# Patient Record
Sex: Male | Born: 1995 | Race: Black or African American | Hispanic: No | Marital: Single | Smoking: Former smoker
Health system: Southern US, Community
[De-identification: ages and names within clinical notes are randomized; demographics above are authoritative.]

## PROBLEM LIST (undated history)

## (undated) DIAGNOSIS — G510 Bell's palsy: Secondary | ICD-10-CM

---
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
## 2017-01-04 ENCOUNTER — Encounter (HOSPITAL_COMMUNITY): Payer: Self-pay | Admitting: Emergency Medicine

## 2017-01-04 ENCOUNTER — Emergency Department (HOSPITAL_COMMUNITY)
Admission: EM | Admit: 2017-01-04 | Discharge: 2017-01-04 | Disposition: A | Discharge status: Home / Self Care | Payer: Medicaid Other | Attending: Emergency Medicine | Admitting: Emergency Medicine

## 2017-01-04 DIAGNOSIS — Z23 Encounter for immunization: Secondary | ICD-10-CM | POA: Insufficient documentation

## 2017-01-04 DIAGNOSIS — S71152A Open bite, left thigh, initial encounter: Secondary | ICD-10-CM | POA: Diagnosis not present

## 2017-01-04 DIAGNOSIS — Y9389 Activity, other specified: Secondary | ICD-10-CM | POA: Diagnosis not present

## 2017-01-04 DIAGNOSIS — Y929 Unspecified place or not applicable: Secondary | ICD-10-CM | POA: Diagnosis not present

## 2017-01-04 DIAGNOSIS — F17210 Nicotine dependence, cigarettes, uncomplicated: Secondary | ICD-10-CM | POA: Insufficient documentation

## 2017-01-04 DIAGNOSIS — Y998 Other external cause status: Secondary | ICD-10-CM | POA: Insufficient documentation

## 2017-01-04 DIAGNOSIS — W540XXA Bitten by dog, initial encounter: Secondary | ICD-10-CM | POA: Insufficient documentation

## 2017-01-04 DIAGNOSIS — T148XXA Other injury of unspecified body region, initial encounter: Secondary | ICD-10-CM

## 2017-01-04 MED ORDER — TETANUS-DIPHTH-ACELL PERTUSSIS 5-2.5-18.5 LF-MCG/0.5 IM SUSP
0.5000 mL | Freq: Once | INTRAMUSCULAR | Status: AC
  Administered 2017-01-04: 0.5 mL via INTRAMUSCULAR
  Filled 2017-01-04: qty 0.5

## 2017-01-04 MED ORDER — IBUPROFEN 800 MG PO TABS: 800.0000 mg | ORAL_TABLET | Freq: Three times a day (TID) | ORAL | 0 refills | Status: DC

## 2017-01-04 MED ORDER — AMOXICILLIN-POT CLAVULANATE 875-125 MG PO TABS: 1.0000 | ORAL_TABLET | Freq: Two times a day (BID) | ORAL | 0 refills | Status: DC

## 2017-01-04 MED ORDER — ACETAMINOPHEN 325 MG PO TABS
ORAL_TABLET | ORAL | Status: AC
  Filled 2017-01-04: qty 2

## 2017-01-04 MED ORDER — BACITRACIN ZINC 500 UNIT/GM EX OINT
1.0000 "application " | TOPICAL_OINTMENT | Freq: Two times a day (BID) | CUTANEOUS | Status: DC
  Administered 2017-01-04: 1 via TOPICAL
  Filled 2017-01-04: qty 1.8

## 2017-01-04 MED ORDER — ACETAMINOPHEN 325 MG PO TABS
650.0000 mg | ORAL_TABLET | Freq: Once | ORAL | Status: AC
  Administered 2017-01-04: 650 mg via ORAL
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
## 2019-08-06 ENCOUNTER — Ambulatory Visit (HOSPITAL_COMMUNITY)
Admission: EM | Admit: 2019-08-06 | Discharge: 2019-08-06 | Disposition: A | Discharge status: Home / Self Care | Payer: BC Managed Care – PPO | Attending: Family Medicine | Admitting: Family Medicine

## 2019-08-06 ENCOUNTER — Other Ambulatory Visit: Payer: Self-pay

## 2019-08-06 ENCOUNTER — Encounter (HOSPITAL_COMMUNITY): Payer: Self-pay

## 2019-08-06 DIAGNOSIS — Z0289 Encounter for other administrative examinations: Secondary | ICD-10-CM | POA: Diagnosis not present

## 2019-08-06 NOTE — ED Triage Notes (Signed)
Pt states he was nauseated this morning and is better now but needs a work note.  Denies vomiting, fever, any other symptoms.

## 2019-08-06 NOTE — ED Provider Notes (Signed)
[place]    CSN: [id number] Arrival date & time: 08/06/19  1426      History   Chief Complaint Chief Complaint  Patient presents with  . Nausea    HPI Walter Mack is a 24 y.o. male.   HPI  Healthy 24 year old.  On no medications.  States he had a wave of nausea this morning and called out of work.  He was told he needed a note to go back to work.  Afterwards he had biscuits and water.  He now feels normal.  Not scheduled to work again until tomorrow. No fever or chills.  No body aches.  No exposure to infection. No abdominal pain.  No diarrhea.  Patient did not vomit. He thinks he ate something that did not agree with him.  Past Medical History:  Diagnosis Date  . Bell's palsy   . Right wrist fracture 2007    There are no problems to display for this patient.   History reviewed. No pertinent surgical history.     Home Medications    Prior to Admission medications   Not on File    Family History Family History  Problem Relation Age of Onset  . Healthy Mother   . Healthy Father     Social History Social History   Tobacco Use  . Smoking status: Former Smoker    Packs/day: 0.25    Years: 2.00    Pack years: 0.50    Types: Cigarettes  . Smokeless tobacco: Never Used  Substance Use Topics  . Alcohol use: No  . Drug use: Yes    Frequency: 2.0 times per week    Types: Marijuana     Allergies   Patient has no known allergies.   Review of Systems Review of Systems  Constitutional: Negative for chills, fatigue and fever.  HENT: Negative for congestion.   Respiratory: Negative for cough and shortness of breath.   Gastrointestinal: Positive for nausea. Negative for diarrhea and vomiting.       Resolved  Musculoskeletal: Negative for myalgias.  Neurological: Negative for headaches.     Physical Exam Triage Vital Signs ED Triage Vitals  Enc Vitals Group     BP 08/06/19 1452 123/71     Pulse Rate 08/06/19 1452 66     Resp  08/06/19 1452 16     Temp 08/06/19 1452 98.1 F (36.7 C)     Temp Source 08/06/19 1452 Oral     SpO2 08/06/19 1452 100 %     Weight --      Height --      Head Circumference --      Peak Flow --      Pain Score 08/06/19 1446 0     Pain Loc --      Pain Edu? --      Excl. in [place]? --    No data found.  Updated Vital Signs BP 123/71 (BP Location: Left Arm)   Pulse 66   Temp 98.1 F (36.7 C) (Oral)   Resp 16   SpO2 100%      Physical Exam Vitals reviewed.  Constitutional:      General: He is not in acute distress.    Appearance: Normal appearance. He is well-developed.     Comments: Exam by observation  HENT:     Head: Normocephalic and atraumatic.     Mouth/Throat:     Comments: Mask in place Eyes:     Conjunctiva/sclera: Conjunctivae  normal.     Pupils: Pupils are equal, round, and reactive to light.  Cardiovascular:     Rate and Rhythm: Normal rate.  Pulmonary:     Effort: Pulmonary effort is normal. No respiratory distress.  Musculoskeletal:        General: Normal range of motion.     Cervical back: Normal range of motion.  Skin:    General: Skin is warm and dry.  Neurological:     Mental Status: He is alert.  Psychiatric:        Mood and Affect: Mood normal.        Behavior: Behavior normal.      UC Treatments / Results  Labs (all labs ordered are listed, but only abnormal results are displayed) Labs Reviewed - No data to display  EKG   Radiology No results found.  Procedures Procedures (including critical care time)  Medications Ordered in UC Medications - No data to display  Initial Impression / Assessment and Plan / UC Course  I have reviewed the triage vital signs and the nursing notes.  Pertinent labs & imaging results that were available during my care of the patient were reviewed by me and considered in my medical decision making (see chart for details).      Final Clinical Impressions(s) / UC Diagnoses   Final diagnoses:    Encounter to obtain excuse from work   Discharge Instructions   None    ED Prescriptions    None     PDMP not reviewed this encounter.   Eustace Moore, MD 08/06/19 [phone]

## 2019-08-29 ENCOUNTER — Encounter (HOSPITAL_COMMUNITY): Payer: Self-pay

## 2019-08-29 ENCOUNTER — Other Ambulatory Visit: Payer: Self-pay

## 2019-08-29 ENCOUNTER — Ambulatory Visit (HOSPITAL_COMMUNITY)
Admission: EM | Admit: 2019-08-29 | Discharge: 2019-08-29 | Disposition: A | Discharge status: Home / Self Care | Payer: BC Managed Care – PPO | Attending: Family Medicine | Admitting: Family Medicine

## 2019-08-29 DIAGNOSIS — J3489 Other specified disorders of nose and nasal sinuses: Secondary | ICD-10-CM | POA: Diagnosis not present

## 2019-08-29 DIAGNOSIS — Z20822 Contact with and (suspected) exposure to covid-19: Secondary | ICD-10-CM | POA: Insufficient documentation

## 2019-08-29 DIAGNOSIS — Z87891 Personal history of nicotine dependence: Secondary | ICD-10-CM | POA: Insufficient documentation

## 2019-08-29 DIAGNOSIS — R519 Headache, unspecified: Secondary | ICD-10-CM | POA: Insufficient documentation

## 2019-08-29 DIAGNOSIS — R0981 Nasal congestion: Secondary | ICD-10-CM | POA: Diagnosis not present

## 2019-08-29 MED ORDER — CETIRIZINE-PSEUDOEPHEDRINE ER 5-120 MG PO TB12: 1.0000 | ORAL_TABLET | Freq: Every day | ORAL | 0 refills | Status: AC

## 2019-08-29 NOTE — ED Provider Notes (Signed)
[place]    CSN: [id number] Arrival date & time: 08/29/19  1342      History   Chief Complaint Chief Complaint  Patient presents with  . Headache    HPI Walter Mack is a 24 y.o. male.   Pt is a 24 year old male that presents with runny nose and nasal congestion with headache. Symptoms have been constant since yesterday. Left work early yesterday due to symptoms. Has not taken anything for the symptoms. No known sick contacts. No fever, chills, body aches, cough.   ROS per HPI      Past Medical History:  Diagnosis Date  . Bell's palsy   . Right wrist fracture 2007    There are no problems to display for this patient.   History reviewed. No pertinent surgical history.     Home Medications    Prior to Admission medications   Medication Sig Start Date End Date Taking? Authorizing Provider  cetirizine-pseudoephedrine (ZYRTEC-D) 5-120 MG tablet Take 1 tablet by mouth daily. 08/29/19   Orvan July, NP    Family History Family History  Problem Relation Age of Onset  . Healthy Mother   . Healthy Father     Social History Social History   Tobacco Use  . Smoking status: Former Smoker    Packs/day: 0.25    Years: 2.00    Pack years: 0.50    Types: Cigarettes  . Smokeless tobacco: Never Used  Substance Use Topics  . Alcohol use: No  . Drug use: Yes    Frequency: 2.0 times per week    Types: Marijuana     Allergies   Patient has no known allergies.   Review of Systems Review of Systems   Physical Exam Triage Vital Signs ED Triage Vitals  Enc Vitals Group     BP 08/29/19 1358 (!) 119/52     Pulse Rate 08/29/19 1358 67     Resp 08/29/19 1358 16     Temp 08/29/19 1358 98.2 F (36.8 C)     Temp Source 08/29/19 1358 Oral     SpO2 08/29/19 1358 97 %     Weight 08/29/19 1359 188 lb (85.3 kg)     Height --      Head Circumference --      Peak Flow --      Pain Score 08/29/19 1400 0     Pain Loc --      Pain Edu? --    Excl. in [place]? --    No data found.  Updated Vital Signs BP (!) 119/52 (BP Location: Left Arm)   Pulse 67   Temp 98.2 F (36.8 C) (Oral)   Resp 16   Wt 188 lb (85.3 kg)   SpO2 97%   BMI 22.29 kg/m   Visual Acuity Right Eye Distance:   Left Eye Distance:   Bilateral Distance:    Right Eye Near:   Left Eye Near:    Bilateral Near:     Physical Exam Vitals and nursing note reviewed.  Constitutional:      Appearance: Normal appearance.  HENT:     Head: Normocephalic and atraumatic.     Nose: Congestion and rhinorrhea present.  Eyes:     Conjunctiva/sclera: Conjunctivae normal.  Pulmonary:     Effort: Pulmonary effort is normal.  Musculoskeletal:        General: Normal range of motion.     Cervical back: Normal range of motion.  Skin:  General: Skin is warm and dry.  Neurological:     Mental Status: He is alert.  Psychiatric:        Mood and Affect: Mood normal.      UC Treatments / Results  Labs (all labs ordered are listed, but only abnormal results are displayed) Labs Reviewed  SARS CORONAVIRUS 2 (TAT 6-24 HRS)    EKG   Radiology No results found.  Procedures Procedures (including critical care time)  Medications Ordered in UC Medications - No data to display  Initial Impression / Assessment and Plan / UC Course  I have reviewed the triage vital signs and the nursing notes.  Pertinent labs & imaging results that were available during my care of the patient were reviewed by me and considered in my medical decision making (see chart for details).     Viral illness vs allergies.  treating with zyrtec D covid swab pending.  Work note given.   Final Clinical Impressions(s) / UC Diagnoses   Final diagnoses:  Sinus headache     Discharge Instructions     Sent Zyrtec-D to the pharmacy to help with your symptoms We will call you if your Covid results are positive.  You can check your MyChart for results.     ED Prescriptions     Medication Sig Dispense Auth. Provider   cetirizine-pseudoephedrine (ZYRTEC-D) 5-120 MG tablet Take 1 tablet by mouth daily. 30 tablet Dahlia Byes A, NP     PDMP not reviewed this encounter.   Janace Aris, NP 08/29/19 1439

## 2019-08-29 NOTE — ED Triage Notes (Signed)
Patient complains of headache and runny nose starting yesterday. States he left work yesterday and needs a Chiropractor note for today and tomorrow.

## 2019-08-29 NOTE — Discharge Instructions (Signed)
Sent Zyrtec-D to the pharmacy to help with your symptoms We will call you if your Covid results are positive.  You can check your MyChart for results.

## 2019-08-30 LAB — SARS CORONAVIRUS 2 (TAT 6-24 HRS): SARS Coronavirus 2: NEGATIVE

## 2020-09-19 ENCOUNTER — Encounter (HOSPITAL_COMMUNITY): Payer: Self-pay

## 2020-09-19 ENCOUNTER — Other Ambulatory Visit: Payer: Self-pay

## 2020-09-19 ENCOUNTER — Ambulatory Visit (HOSPITAL_COMMUNITY)
Admission: EM | Admit: 2020-09-19 | Discharge: 2020-09-19 | Disposition: A | Discharge status: Home / Self Care | Payer: BC Managed Care – PPO | Attending: Physician Assistant | Admitting: Physician Assistant

## 2020-09-19 DIAGNOSIS — T148XXA Other injury of unspecified body region, initial encounter: Secondary | ICD-10-CM

## 2020-09-19 MED ORDER — CYCLOBENZAPRINE HCL 5 MG PO TABS: 5.0000 mg | ORAL_TABLET | Freq: Three times a day (TID) | ORAL | 0 refills | Status: AC | PRN

## 2020-09-19 NOTE — Discharge Instructions (Addendum)
Recommend ice to affected areas Take flexeril as needed for muscle spasm Can take ibuprofen as needed.
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
## 2021-03-05 ENCOUNTER — Ambulatory Visit (HOSPITAL_COMMUNITY)
Admission: EM | Admit: 2021-03-05 | Discharge: 2021-03-05 | Disposition: A | Discharge status: Home / Self Care | Payer: Self-pay | Attending: Student | Admitting: Student

## 2021-03-05 ENCOUNTER — Ambulatory Visit (INDEPENDENT_AMBULATORY_CARE_PROVIDER_SITE_OTHER): Payer: Self-pay

## 2021-03-05 ENCOUNTER — Other Ambulatory Visit: Payer: Self-pay

## 2021-03-05 DIAGNOSIS — S92411A Displaced fracture of proximal phalanx of right great toe, initial encounter for closed fracture: Secondary | ICD-10-CM

## 2021-03-05 DIAGNOSIS — S91209A Unspecified open wound of unspecified toe(s) with damage to nail, initial encounter: Secondary | ICD-10-CM

## 2021-03-05 MED ORDER — TRAMADOL HCL 50 MG PO TABS: 50.0000 mg | ORAL_TABLET | Freq: Four times a day (QID) | ORAL | 0 refills | Status: AC | PRN

## 2021-03-05 MED ORDER — CEPHALEXIN 500 MG PO CAPS
500.0000 mg | ORAL_CAPSULE | Freq: Four times a day (QID) | ORAL | 0 refills | Status: AC
Start: 2021-03-05 — End: ?

## 2021-03-05 NOTE — Discharge Instructions (Addendum)
-  Tramadol for pain, up to every 6 hours. Dont take before driving. Avoid alcohol while taking this medications.  -Start the antibiotic: Keflex, 4x daily x5 days. You can take this with food if you have a sensitive stomach. -Wash your wound with gentle soap and water 1-2 times daily.  Let air dry or gently pat. You can follow with over-the-counter neosporin ointment (or similar). Keep wrapped during the day or when you're doing something that could get it dirty (working, [employer], cooking, Catering manager). Avoid cleansing with hydrogen peroxide or alcohol!! -Seek additional medical attention if the wound is getting worse instead of better- redness increasing in size, pain getting worse, new/worsening discharge, new fevers/chills, etc. -You have a broken big toe. Please follow-up with an orthopedist. I recommend [facility] at [address] [place], [place] [address]. You can schedule an appointment by calling [phone]) or online ([email or web address]), but they also have a walk-in clinic M-F 8a-8p and Sat 10a-3p.
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
## 2022-02-09 IMAGING — DX DG FOOT COMPLETE 3+V*R*
3 series · 3 of 3 positions shown · non-contrast
Comparison: None.

CLINICAL DATA: Great toe injury.

EXAM:
RIGHT FOOT COMPLETE - 3+ VIEW

[foot ap]
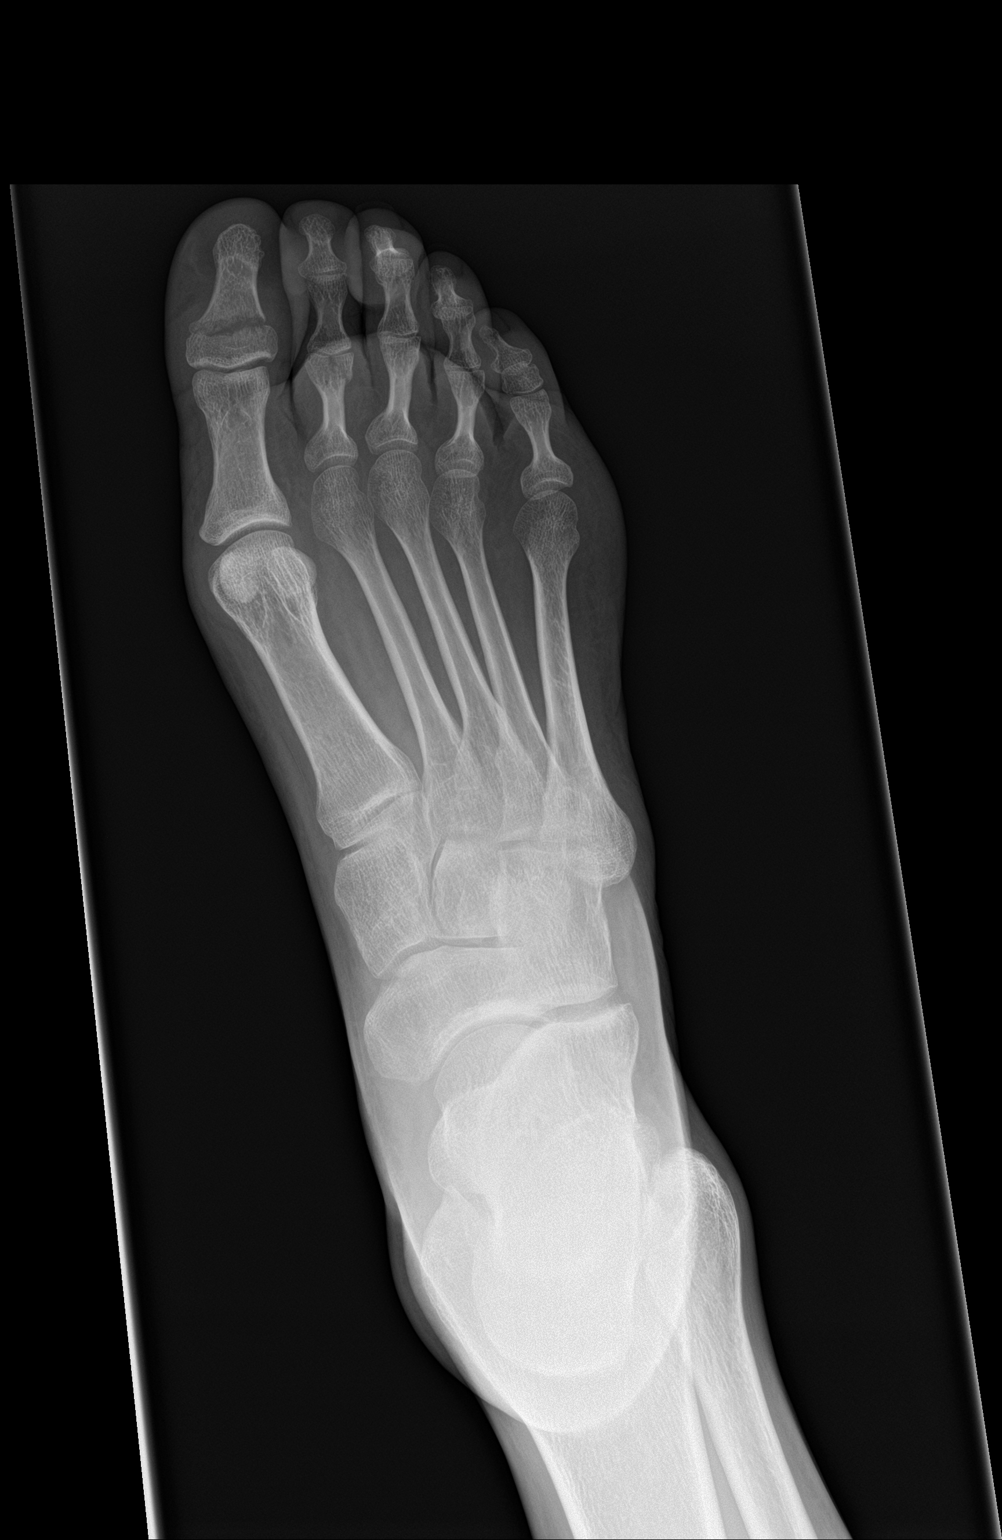

[foot obl]
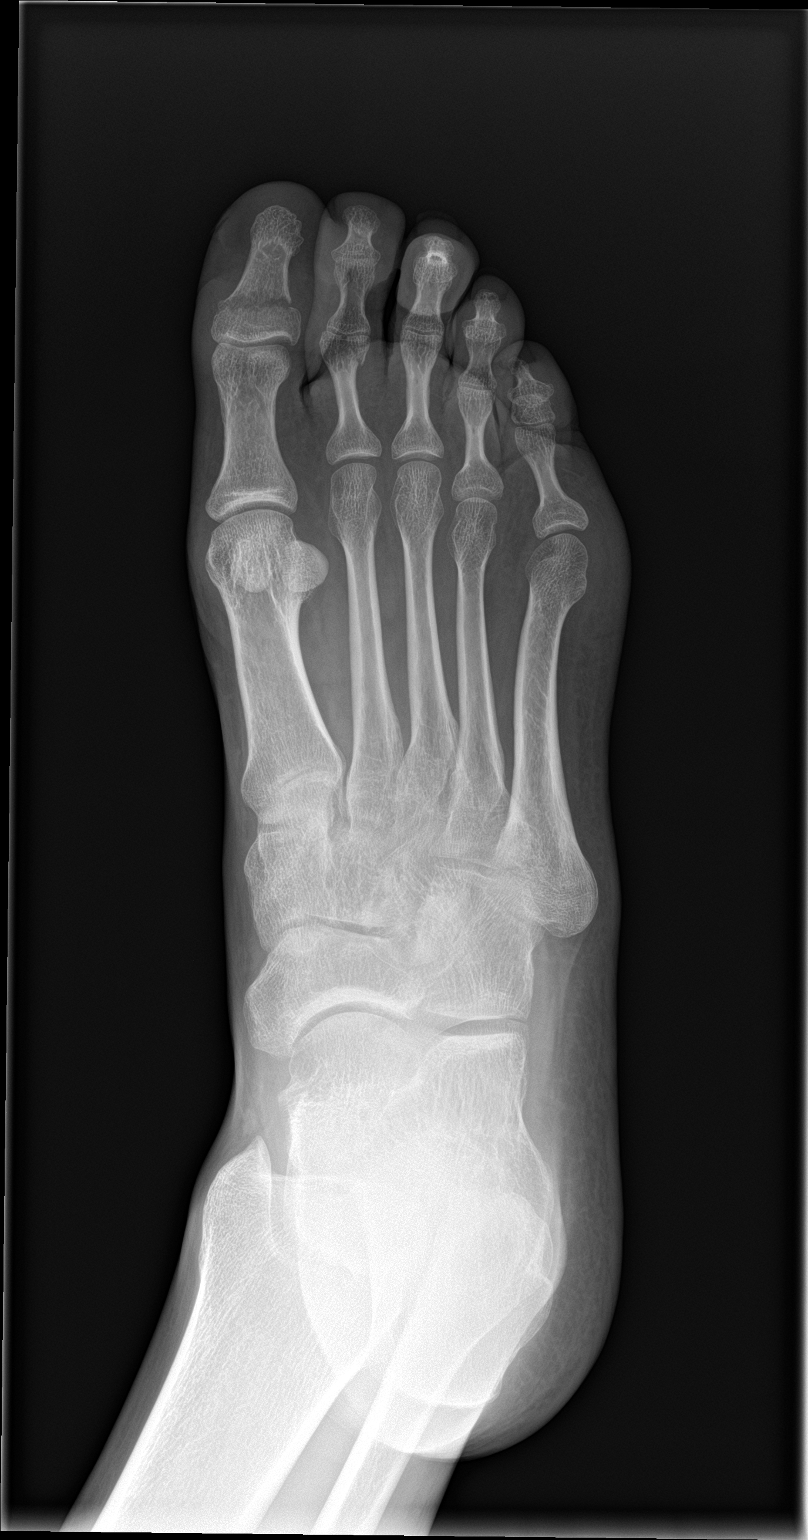

[foot lat]
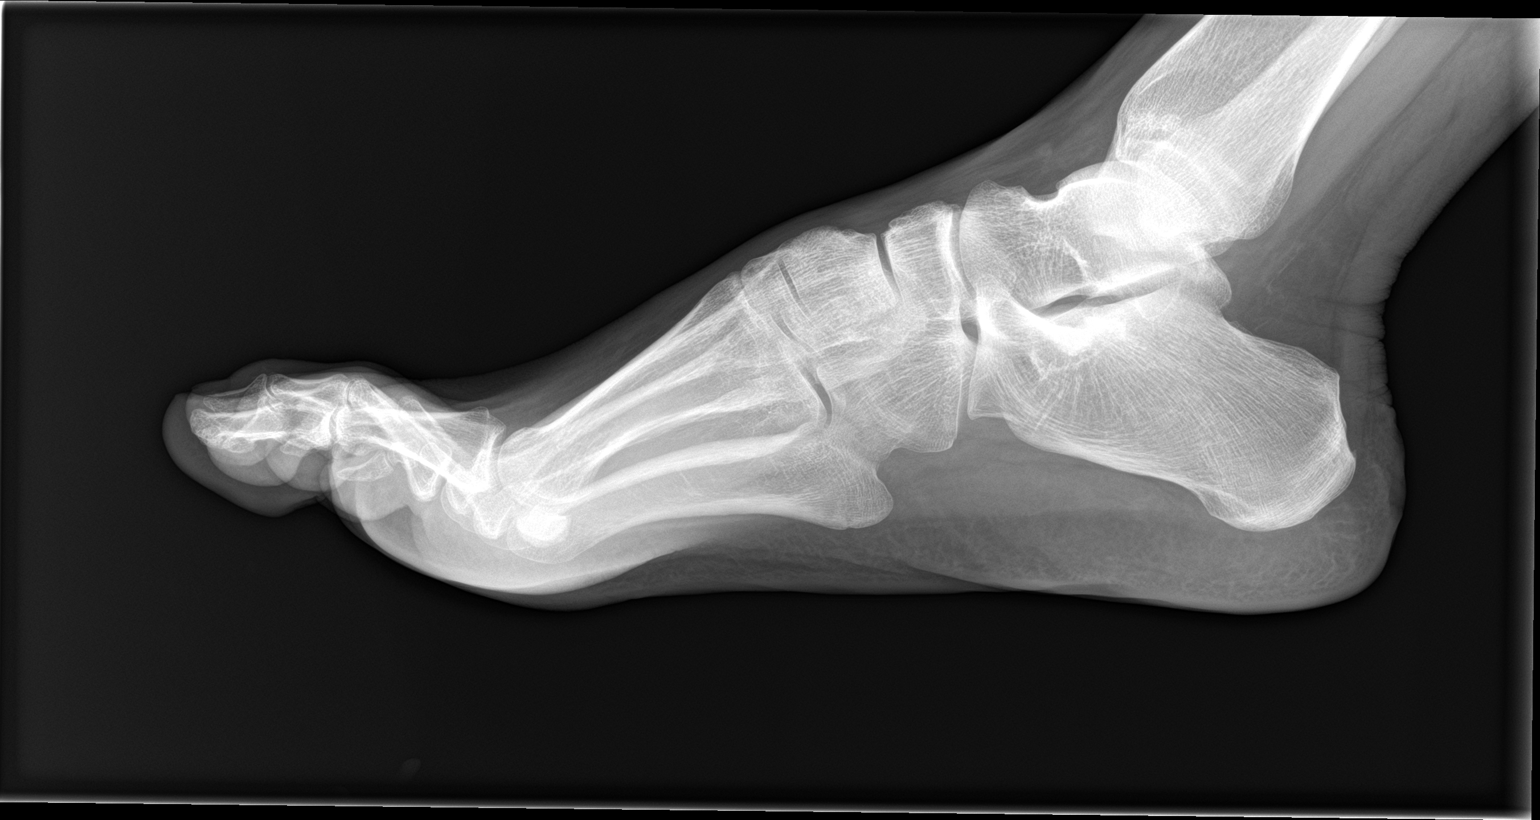

[3 of 3 positions shown; findings below may reference images not displayed]

FINDINGS: Minimally displaced, transverse fracture involving the proximal
portion of the distal phalanx of the first digit. No intra-articular
extension.
IMPRESSION: Distal phalangeal fracture, first digit.

## 2022-05-19 ENCOUNTER — Other Ambulatory Visit: Payer: Self-pay

## 2022-05-19 ENCOUNTER — Emergency Department (HOSPITAL_COMMUNITY)
Admission: EM | Admit: 2022-05-19 | Discharge: 2022-05-20 | Discharge status: Against Medical Advice | Payer: Medicaid Other | Source: Home / Self Care

## 2022-05-19 ENCOUNTER — Emergency Department (HOSPITAL_COMMUNITY)
Admission: EM | Admit: 2022-05-19 | Discharge: 2022-05-19 | Disposition: A | Discharge status: Home / Self Care | Payer: Medicaid Other | Attending: Emergency Medicine | Admitting: Emergency Medicine

## 2022-05-19 ENCOUNTER — Encounter (HOSPITAL_COMMUNITY): Payer: Self-pay | Admitting: *Deleted

## 2022-05-19 DIAGNOSIS — M79604 Pain in right leg: Secondary | ICD-10-CM | POA: Insufficient documentation

## 2022-05-19 DIAGNOSIS — M25562 Pain in left knee: Secondary | ICD-10-CM | POA: Insufficient documentation

## 2022-05-19 DIAGNOSIS — Z5321 Procedure and treatment not carried out due to patient leaving prior to being seen by health care provider: Secondary | ICD-10-CM | POA: Insufficient documentation

## 2022-05-19 DIAGNOSIS — M79605 Pain in left leg: Secondary | ICD-10-CM | POA: Insufficient documentation

## 2022-05-19 MED ORDER — MELOXICAM 7.5 MG PO TABS: 15.0000 mg | ORAL_TABLET | Freq: Every day | ORAL | 0 refills | Status: AC

## 2022-05-19 NOTE — ED Triage Notes (Signed)
The  pt reports bi-lateral leg pain for 2-3 days  he just started a job  for the first time in 6 years and he walks on  a concrete floor he was seen last pm and he is here tonight for a doctors note

## 2022-05-19 NOTE — ED Provider Notes (Signed)
[facility] EMERGENCY DEPARTMENT Provider Note   CSN: [id number] Arrival date & time: 05/19/22  0110     History  Chief Complaint  Patient presents with   Leg Pain    Walter Mack is a 26 y.o. male.  The history is provided by the patient and medical records.  Leg Pain   26 y.o. M here with bilateral leg pain.  Started new job at [employer] 3 weeks ago in [place] [employer].  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  Thinks the boots are making him walk funny which is causing bilateral leg pain.  He denies falls/trauma.  No numbness/weakness of the legs.  States once he takes his boots off his legs feel better.  No intervention tried PTA.  Home Medications Prior to Admission medications   Medication Sig Start Date End Date Taking? Authorizing Provider  meloxicam (MOBIC) 7.5 MG tablet Take 2 tablets (15 mg total) by mouth daily. 05/19/22  Yes Garlon Hatchet, PA-C  cephALEXin (KEFLEX) 500 MG capsule Take 1 capsule (500 mg total) by mouth 4 (four) times daily. 03/05/21   Rhys Martini, PA-C  cetirizine-pseudoephedrine (ZYRTEC-D) 5-120 MG tablet Take 1 tablet by mouth daily. 08/29/19   Dahlia Byes A, NP  cyclobenzaprine (FLEXERIL) 5 MG tablet Take 1 tablet (5 mg total) by mouth 3 (three) times daily as needed for muscle spasms. 09/19/20   Ward, Tylene Fantasia, PA-C  traMADol (ULTRAM) 50 MG tablet Take 1 tablet (50 mg total) by mouth every 6 (six) hours as needed. 03/05/21   Rhys Martini, PA-C      Allergies    Patient has no known allergies.    Review of Systems   Review of Systems  Musculoskeletal:  Positive for arthralgias.  All other systems reviewed and are negative.   Physical Exam Updated Vital Signs BP (!) 149/73 (BP Location: Right Arm)   Pulse 88   Temp 98 F (36.7 C) (Oral)   Resp 15   SpO2 98%   Physical Exam Vitals and nursing note reviewed.  Constitutional:      Appearance: He is well-developed.  HENT:     Head:  Normocephalic and atraumatic.  Eyes:     Conjunctiva/sclera: Conjunctivae normal.     Pupils: Pupils are equal, round, and reactive to light.  Cardiovascular:     Rate and Rhythm: Normal rate and regular rhythm.     Heart sounds: Normal heart sounds.  Pulmonary:     Effort: Pulmonary effort is normal. No respiratory distress.     Breath sounds: Normal breath sounds. No rhonchi.  Abdominal:     General: Bowel sounds are normal.     Palpations: Abdomen is soft.     Tenderness: There is no abdominal tenderness. There is no rebound.  Musculoskeletal:        General: Normal range of motion.     Cervical back: Normal range of motion.     Comments: No deformities of the legs, ambulatory with steady gait  Skin:    General: Skin is warm and dry.  Neurological:     Mental Status: He is alert and oriented to person, place, and time.     ED Results / Procedures / Treatments   Labs (all labs ordered are listed, but only abnormal results are displayed) Labs Reviewed - No data to display  EKG None  Radiology No results found.  Procedures Procedures    Medications Ordered in  ED Medications - No data to display  ED Course/ Medical Decision Making/ A&P                           Medical Decision Making Risk Prescription drug management.   26 y.o. M here with bilateral leg pain.  Started new job wearing steel toe boots, thinks they are causing his legs to hurt.  Pain improves when he removes his boots.  Legs without deformities on exam.  No reported falls/trauma.  Do not feel he needs imaging.  Stable for discharge with symptomatic care.  Can follow-up with PCP.  Work note given.  Return here for new concerns.  Final Clinical Impression(s) / ED Diagnoses Final diagnoses:  Bilateral leg pain    Rx / DC Orders ED Discharge Orders          Ordered    meloxicam (MOBIC) 7.5 MG tablet  Daily        05/19/22 0123              Garlon Hatchet, PA-C 05/19/22 0126     Gilda Crease, MD 05/20/22 [phone]

## 2022-05-19 NOTE — Discharge Instructions (Addendum)
Pick up prescription from pharmacy, take as directed. Follow-up with your primary care doctor. Return here for new concerns.

## 2022-05-19 NOTE — ED Provider Triage Note (Signed)
Emergency Medicine Provider Triage Evaluation Note  Walter Mack , a 26 y.o. male  was evaluated in triage.  Pt complains of left side leg and knee pain.  Patient was seen in ED last night for same.  Reports it has not worsened, but seems to be improving.  States his leg feels "tight".  He is requesting a doctor's note.    Review of Systems  Positive: See above Negative: See above  Physical Exam  Ht 6\' 5"  (1.956 m)   Wt 85.3 kg   BMI 22.30 kg/m  Gen:   Awake, no distress   Resp:  Normal effort  MSK:   Moves extremities without difficulty, no deformities   Other:  Patient ambulating without difficulty or assistance  Medical Decision Making  Medically screening exam initiated at 9:54 PM.  Appropriate orders placed.  was informed that the remainder of the evaluation will be completed by another provider, this initial triage assessment does not replace that evaluation, and the importance of remaining in the ED until their evaluation is complete.     Sherri Sear, Lenard Simmer 05/19/22 2156

## 2022-05-19 NOTE — ED Triage Notes (Signed)
Pt states that he has been working a new job for the past few weeks and now his legs and feet hurt due to his new shoes

## 2022-05-20 NOTE — ED Notes (Signed)
NA x3 vitals
